# Patient Record
Sex: Male | Born: 1985 | Race: White | Hispanic: No | Marital: Single | Smoking: Current every day smoker
Health system: Southern US, Community
[De-identification: ages and names within clinical notes are randomized; demographics above are authoritative.]

## PROBLEM LIST (undated history)

## (undated) DIAGNOSIS — G8929 Other chronic pain: Secondary | ICD-10-CM

## (undated) DIAGNOSIS — F419 Anxiety disorder, unspecified: Secondary | ICD-10-CM

## (undated) HISTORY — PX: OTHER SURGICAL HISTORY: SHX169

---
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
## 2009-04-10 ENCOUNTER — Emergency Department (HOSPITAL_COMMUNITY): Admission: EM | Admit: 2009-04-10 | Discharge: 2009-04-10 | Payer: Self-pay | Admitting: Emergency Medicine

## 2009-04-16 ENCOUNTER — Ambulatory Visit (HOSPITAL_COMMUNITY): Admission: RE | Admit: 2009-04-16 | Discharge: 2009-04-17 | Payer: Self-pay | Admitting: Orthopedic Surgery

## 2010-08-07 ENCOUNTER — Emergency Department (HOSPITAL_BASED_OUTPATIENT_CLINIC_OR_DEPARTMENT_OTHER)
Admission: EM | Admit: 2010-08-07 | Discharge: 2010-08-07 | Disposition: A | Discharge status: Home / Self Care | Payer: Self-pay | Attending: Emergency Medicine | Admitting: Emergency Medicine

## 2010-08-07 ENCOUNTER — Emergency Department (INDEPENDENT_AMBULATORY_CARE_PROVIDER_SITE_OTHER): Payer: Self-pay

## 2010-08-07 DIAGNOSIS — M25539 Pain in unspecified wrist: Secondary | ICD-10-CM | POA: Insufficient documentation

## 2010-08-07 DIAGNOSIS — S0003XA Contusion of scalp, initial encounter: Secondary | ICD-10-CM | POA: Insufficient documentation

## 2010-08-07 DIAGNOSIS — S1093XA Contusion of unspecified part of neck, initial encounter: Secondary | ICD-10-CM | POA: Insufficient documentation

## 2010-08-07 DIAGNOSIS — R51 Headache: Secondary | ICD-10-CM | POA: Insufficient documentation

## 2010-08-07 DIAGNOSIS — S0083XA Contusion of other part of head, initial encounter: Secondary | ICD-10-CM

## 2010-08-07 DIAGNOSIS — M79609 Pain in unspecified limb: Secondary | ICD-10-CM

## 2010-08-07 DIAGNOSIS — F17200 Nicotine dependence, unspecified, uncomplicated: Secondary | ICD-10-CM | POA: Insufficient documentation

## 2010-09-22 LAB — COMPREHENSIVE METABOLIC PANEL
AST: 31 U/L (ref 0–37)
Albumin: 4.3 g/dL (ref 3.5–5.2)
BUN: 8 mg/dL (ref 6–23)
Chloride: 101 mEq/L (ref 96–112)
Creatinine, Ser: 0.84 mg/dL (ref 0.4–1.5)
GFR calc Af Amer: >60 mL/min (ref >60)
Potassium: 3.8 mEq/L (ref 3.5–5.1)
Total Bilirubin: 1 mg/dL (ref 0.3–1.2)
Total Protein: 7.1 g/dL (ref 6.0–8.3)

## 2010-09-22 LAB — PROTIME-INR: INR: 0.95 (ref 0.00–1.49)

## 2010-09-22 LAB — APTT: aPTT: 30 seconds (ref 24–37)

## 2010-09-22 LAB — CBC
MCV: 93.2 fL (ref 78.0–100.0)
Platelets: 246 10*3/uL (ref 150–400)
RDW: 13 % (ref 11.5–15.5)
WBC: 5.7 10*3/uL (ref 4.0–10.5)

## 2011-05-13 ENCOUNTER — Emergency Department (HOSPITAL_COMMUNITY): Payer: Self-pay

## 2011-05-13 ENCOUNTER — Emergency Department (HOSPITAL_COMMUNITY)
Admission: EM | Admit: 2011-05-13 | Discharge: 2011-05-13 | Disposition: A | Discharge status: Home / Self Care | Payer: Self-pay | Attending: Emergency Medicine | Admitting: Emergency Medicine

## 2011-05-13 ENCOUNTER — Encounter: Payer: Self-pay | Admitting: *Deleted

## 2011-05-13 DIAGNOSIS — Z79899 Other long term (current) drug therapy: Secondary | ICD-10-CM | POA: Insufficient documentation

## 2011-05-13 DIAGNOSIS — S63509A Unspecified sprain of unspecified wrist, initial encounter: Secondary | ICD-10-CM

## 2011-05-13 DIAGNOSIS — S93609A Unspecified sprain of unspecified foot, initial encounter: Secondary | ICD-10-CM

## 2011-05-13 DIAGNOSIS — S59909A Unspecified injury of unspecified elbow, initial encounter: Secondary | ICD-10-CM | POA: Insufficient documentation

## 2011-05-13 DIAGNOSIS — S99919A Unspecified injury of unspecified ankle, initial encounter: Secondary | ICD-10-CM | POA: Insufficient documentation

## 2011-05-13 DIAGNOSIS — W108XXA Fall (on) (from) other stairs and steps, initial encounter: Secondary | ICD-10-CM | POA: Insufficient documentation

## 2011-05-13 DIAGNOSIS — S8990XA Unspecified injury of unspecified lower leg, initial encounter: Secondary | ICD-10-CM | POA: Insufficient documentation

## 2011-05-13 DIAGNOSIS — F17200 Nicotine dependence, unspecified, uncomplicated: Secondary | ICD-10-CM | POA: Insufficient documentation

## 2011-05-13 DIAGNOSIS — S6990XA Unspecified injury of unspecified wrist, hand and finger(s), initial encounter: Secondary | ICD-10-CM | POA: Insufficient documentation

## 2011-05-13 MED ORDER — OXYCODONE-ACETAMINOPHEN 5-325 MG PO TABS: 2.0000 | ORAL_TABLET | ORAL | Status: AC | PRN

## 2011-05-13 NOTE — ED Provider Notes (Signed)
History     CSN: [id number] Arrival date & time: 05/13/2011  8:50 AM   First MD Initiated Contact with Patient 05/13/11 [phone]      Chief Complaint  Patient presents with  . Fall  . Foot Pain    s/p injury 2.5 months  . Arm Injury    left arm    (Consider location/radiation/quality/duration/timing/severity/associated sxs/prior treatment) HPI Comments: Tripped on steps this morning, injured left foot and left forearm.  Recent crush injury to the foot and history of surgery to repair a fracture in the left forearm 2 or 3 years ago.  Patient is a 25 y.o. male presenting with fall, lower extremity pain, and arm injury. The history is provided by the patient.  Fall The accident occurred 3 to 5 hours ago. The fall occurred while walking. He landed on carpet. The pain is moderate. He was ambulatory at the scene. There was no entrapment after the fall. The symptoms are aggravated by activity and ambulation.  Foot Pain  Arm Injury     History reviewed. No pertinent past medical history.  History reviewed. No pertinent past surgical history.  No family history on file.  History  Substance Use Topics  . Smoking status: Current Everyday Smoker  . Smokeless tobacco: Not on file  . Alcohol Use: Yes      Review of Systems  All other systems reviewed and are negative.    Allergies  Hydrocodone  Home Medications   Current Outpatient Rx  Name Route Sig Dispense Refill  . OXYCODONE-ACETAMINOPHEN 10-325 MG PO TABS Oral Take 1 tablet by mouth every 4 (four) hours as needed.        BP 130/79  Pulse 104  Temp(Src) 98.9 F (37.2 C) (Oral)  Resp 18  SpO2 95%  Physical Exam  Nursing note and vitals reviewed. Constitutional: He is oriented to person, place, and time. He appears well-developed and well-nourished. No distress.  HENT:  Head: Normocephalic and atraumatic.  Neck: Normal range of motion. Neck supple.  Musculoskeletal:       There is swelling of the left foot,  ttp over the dorsal aspect.  There is also crepitus over the mid-forearm with flexion and extension of the wrist.  Neurovasc intact and no deformity or swelling.  Neurological: He is alert and oriented to person, place, and time.  Skin: Skin is warm and dry.    ED Course  Procedures (including critical care time)  Labs Reviewed - No data to display No results found.   No diagnosis found.    MDM  Xrays show possible irregularity of the navicular bone that is subacute, but no other acute process.  Patient is out of his pain meds, will prescribe a few more.  Needs to see ortho for follow up.        Geoffery Lyons, MD 05/13/11 1044

## 2011-05-13 NOTE — ED Notes (Signed)
Pt fell this am landed on injured left foot, now has increased pain and swelling, also pain in left f/a

## 2011-05-13 NOTE — ED Notes (Signed)
Patient transported to X-ray 

## 2011-11-02 ENCOUNTER — Emergency Department (HOSPITAL_BASED_OUTPATIENT_CLINIC_OR_DEPARTMENT_OTHER): Payer: No Typology Code available for payment source

## 2011-11-02 ENCOUNTER — Encounter (HOSPITAL_BASED_OUTPATIENT_CLINIC_OR_DEPARTMENT_OTHER): Payer: Self-pay | Admitting: *Deleted

## 2011-11-02 ENCOUNTER — Emergency Department (HOSPITAL_BASED_OUTPATIENT_CLINIC_OR_DEPARTMENT_OTHER)
Admission: EM | Admit: 2011-11-02 | Discharge: 2011-11-02 | Disposition: A | Discharge status: Home / Self Care | Payer: No Typology Code available for payment source | Attending: Emergency Medicine | Admitting: Emergency Medicine

## 2011-11-02 DIAGNOSIS — W503XXA Accidental bite by another person, initial encounter: Secondary | ICD-10-CM

## 2011-11-02 DIAGNOSIS — S0181XA Laceration without foreign body of other part of head, initial encounter: Secondary | ICD-10-CM

## 2011-11-02 DIAGNOSIS — S025XXA Fracture of tooth (traumatic), initial encounter for closed fracture: Secondary | ICD-10-CM

## 2011-11-02 DIAGNOSIS — S0180XA Unspecified open wound of other part of head, initial encounter: Secondary | ICD-10-CM | POA: Insufficient documentation

## 2011-11-02 MED ORDER — AMOXICILLIN-POT CLAVULANATE 875-125 MG PO TABS: 1.0000 | ORAL_TABLET | Freq: Two times a day (BID) | ORAL | Status: AC
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
## 2011-11-10 ENCOUNTER — Emergency Department (HOSPITAL_BASED_OUTPATIENT_CLINIC_OR_DEPARTMENT_OTHER)
Admission: EM | Admit: 2011-11-10 | Discharge: 2011-11-10 | Disposition: A | Discharge status: Home / Self Care | Payer: No Typology Code available for payment source | Attending: Emergency Medicine | Admitting: Emergency Medicine

## 2011-11-10 ENCOUNTER — Encounter (HOSPITAL_BASED_OUTPATIENT_CLINIC_OR_DEPARTMENT_OTHER): Payer: Self-pay | Admitting: *Deleted

## 2011-11-10 DIAGNOSIS — F17200 Nicotine dependence, unspecified, uncomplicated: Secondary | ICD-10-CM | POA: Insufficient documentation

## 2011-11-10 DIAGNOSIS — Z4802 Encounter for removal of sutures: Secondary | ICD-10-CM | POA: Insufficient documentation

## 2011-11-10 DIAGNOSIS — G8929 Other chronic pain: Secondary | ICD-10-CM | POA: Insufficient documentation

## 2011-11-10 HISTORY — DX: Other chronic pain: G89.29

## 2011-11-10 HISTORY — DX: Anxiety disorder, unspecified: F41.9

## 2011-11-10 MED ORDER — IBUPROFEN 800 MG PO TABS: 800.0000 mg | ORAL_TABLET | Freq: Three times a day (TID) | ORAL | Status: AC

## 2011-11-10 MED ORDER — IBUPROFEN 800 MG PO TABS: 800.0000 mg | ORAL_TABLET | Freq: Three times a day (TID) | ORAL | Status: DC

## 2011-11-10 NOTE — ED Provider Notes (Signed)
History     CSN: [id number]  Arrival date & time 11/10/11  1218   First MD Initiated Contact with Patient 11/10/11 1246      Chief Complaint  Patient presents with  . Suture / Staple Removal    (Consider location/radiation/quality/duration/timing/severity/associated sxs/prior treatment) Patient is a 26 y.o. male presenting with suture removal. The history is provided by the patient. No language interpreter was used.  Suture / Staple Removal  The sutures were placed 7 to 10 days ago. There has been no treatment since the wound repair. There has been no drainage from the wound. There is no redness present. There is no swelling present.  Pt is having continued pain to head and back.   Pt is scheduled to see dentist for broken tooth  Past Medical History  Diagnosis Date  . Chronic pain   . Anxiety     Past Surgical History  Procedure Date  . Arm surgery     right    No family history on file.  History  Substance Use Topics  . Smoking status: Current Everyday Smoker -- 0.5 packs/day  . Smokeless tobacco: Not on file  . Alcohol Use: Yes     rarely      Review of Systems  HENT: Positive for dental problem.   All other systems reviewed and are negative.    Allergies  Hydrocodone  Home Medications   Current Outpatient Rx  Name Route Sig Dispense Refill  . GABAPENTIN 300 MG PO CAPS Oral Take 300 mg by mouth 3 (three) times daily.    . OXYCODONE-ACETAMINOPHEN 10-325 MG PO TABS Oral Take 1 tablet by mouth every 4 (four) hours as needed.      Marland Kitchen TRAMADOL HCL 50 MG PO TABS Oral Take 50 mg by mouth every 6 (six) hours as needed.    . AMOXICILLIN-POT CLAVULANATE 875-125 MG PO TABS Oral Take 1 tablet by mouth 2 (two) times daily. 10 tablet 0    BP 127/76  Pulse 90  Temp(Src) 98.1 F (36.7 C) (Oral)  Resp 20  Ht 5\' 7"  (1.702 m)  Wt 148 lb (67.132 kg)  BMI 23.18 kg/m2  SpO2 100%  Physical Exam  Constitutional: He appears well-developed and well-nourished.  HENT:   Head: Normocephalic and atraumatic.       Broken tooth  Eyes: Conjunctivae are normal. Pupils are equal, round, and reactive to light.  Musculoskeletal: Normal range of motion.  Neurological: He is alert.  Skin: Skin is warm.       Healed laceration right forehead    ED Course  Procedures (including critical care time)  Labs Reviewed - No data to display No results found.   No diagnosis found.    MDM  Ibuprofen.  Pt request percocet.  I advised ibuprofen for soreness.  Stacy Rn in with me to discuss with pt and mother        Elson Areas, [place] 11/10/11 1327  Lonia Skinner [place], [place] 11/10/11 1353

## 2011-11-10 NOTE — ED Notes (Signed)
Patient states he is here for removal of stitches on right forehead.  States he is having a lot of pain and no one has given him any pain medications.

## 2011-11-10 NOTE — ED Notes (Signed)
Pt. Wanting Percocet script before he goes home.

## 2011-11-10 NOTE — Discharge Instructions (Signed)
Suture Removal You have had your sutures (stitches) removed today. This means your wound has healed well enough to take out your stitches. Be careful to protect the wound area over the next several weeks. An injury this area could cause the cut to split open again. It usually takes 1-2 years for a scar to get its full strength and loose its redness. For wounds that heal slowly, tapes may be applied to reinforce the skin for several days after the stitches are removed. You may allow the sutured area to get wet. Topical antibiotics (antibiotics you put on your skin) are not usually needed at this point. Applying vitamin E oil and aloe vera ointments may help the wound heal faster and stronger. Some scars form extra pigment with exposure to sunlight during the first 6-12 months after repair. This can be prevented by using a sun block (SPF 15-30) on the affected area. Call your doctor if you have any concerns about your injury. Call right away if you have any evidence of wound infection such as increased pain, drainage, redness, or swelling. Document Released: 07/13/2004 Document Revised: 05/25/2011 Document Reviewed: 03/27/2008 [employer] Patient Information 2012 [employer]. 

## 2011-11-20 NOTE — ED Provider Notes (Signed)
Medical screening examination/treatment/procedure(s) were performed by non-physician practitioner and as supervising physician I was immediately available for consultation/collaboration.  Arneisha Kincannon, MD 11/20/11 0852 

## 2014-08-17 ENCOUNTER — Emergency Department (HOSPITAL_BASED_OUTPATIENT_CLINIC_OR_DEPARTMENT_OTHER)
Admission: EM | Admit: 2014-08-17 | Discharge: 2014-08-17 | Disposition: A | Discharge status: Home / Self Care | Payer: No Typology Code available for payment source | Attending: Emergency Medicine | Admitting: Emergency Medicine

## 2014-08-17 ENCOUNTER — Encounter (HOSPITAL_BASED_OUTPATIENT_CLINIC_OR_DEPARTMENT_OTHER): Payer: Self-pay | Admitting: Emergency Medicine

## 2014-08-17 ENCOUNTER — Emergency Department (HOSPITAL_BASED_OUTPATIENT_CLINIC_OR_DEPARTMENT_OTHER): Payer: No Typology Code available for payment source

## 2014-08-17 DIAGNOSIS — R51 Headache: Secondary | ICD-10-CM | POA: Insufficient documentation

## 2014-08-17 DIAGNOSIS — Y92410 Unspecified street and highway as the place of occurrence of the external cause: Secondary | ICD-10-CM | POA: Insufficient documentation

## 2014-08-17 DIAGNOSIS — Z720 Tobacco use: Secondary | ICD-10-CM | POA: Insufficient documentation

## 2014-08-17 DIAGNOSIS — G8929 Other chronic pain: Secondary | ICD-10-CM | POA: Insufficient documentation

## 2014-08-17 DIAGNOSIS — Y998 Other external cause status: Secondary | ICD-10-CM | POA: Insufficient documentation

## 2014-08-17 DIAGNOSIS — Z8659 Personal history of other mental and behavioral disorders: Secondary | ICD-10-CM | POA: Insufficient documentation

## 2014-08-17 DIAGNOSIS — Y9389 Activity, other specified: Secondary | ICD-10-CM | POA: Insufficient documentation

## 2014-08-17 DIAGNOSIS — S9032XA Contusion of left foot, initial encounter: Secondary | ICD-10-CM | POA: Insufficient documentation

## 2014-08-17 MED ORDER — TRAMADOL HCL 50 MG PO TABS: 50.0000 mg | ORAL_TABLET | Freq: Four times a day (QID) | ORAL | Status: AC | PRN
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
## 2014-08-17 NOTE — Discharge Instructions (Signed)
Contusion °A contusion is a deep bruise. Contusions happen when an injury causes bleeding under the skin. Signs of bruising include pain, puffiness (swelling), and discolored skin. The contusion may turn blue, purple, or yellow. °HOME CARE  °· Put ice on the injured area. °¨ Put ice in a plastic bag. °¨ Place a towel between your skin and the bag. °¨ Leave the ice on for 15-20 minutes, 03-04 times a day. °· Only take medicine as told by your doctor. °· Rest the injured area. °· If possible, raise (elevate) the injured area to lessen puffiness. °GET HELP RIGHT AWAY IF:  °· You have more bruising or puffiness. °· You have pain that is getting worse. °· Your puffiness or pain is not helped by medicine. °MAKE SURE YOU:  °· Understand these instructions. °· Will watch your condition. °· Will get help right away if you are not doing well or get worse. °Document Released: 11/22/2007 Document Revised: 08/28/2011 Document Reviewed: 04/10/2011 °[employer]® Patient Information ©2015 [employer]. This information is not intended to replace advice given to you by your health care provider. Make sure you discuss any questions you have with your health care provider. ° °
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
